# Patient Record
Sex: Male | Born: 2005 | Race: White | Hispanic: No | Marital: Single
Health system: Southern US, Community
[De-identification: ages and names within clinical notes are randomized; demographics above are authoritative.]

---
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
## 2024-12-17 ENCOUNTER — Other Ambulatory Visit: Payer: Self-pay

## 2024-12-17 ENCOUNTER — Emergency Department (HOSPITAL_COMMUNITY)
Admission: EM | Admit: 2024-12-17 | Discharge: 2024-12-17 | Disposition: A | Discharge status: Home / Self Care | Attending: Emergency Medicine | Admitting: Emergency Medicine

## 2024-12-17 DIAGNOSIS — T161XXA Foreign body in right ear, initial encounter: Secondary | ICD-10-CM | POA: Diagnosis present

## 2024-12-17 DIAGNOSIS — W44F4XA Insect entering into or through a natural orifice, initial encounter: Secondary | ICD-10-CM | POA: Diagnosis not present

## 2024-12-17 MED ORDER — OXYCODONE-ACETAMINOPHEN 5-325 MG PO TABS
1.0000 | ORAL_TABLET | Freq: Once | ORAL | Status: AC
  Administered 2024-12-17: 1 via ORAL
  Filled 2024-12-17: qty 1

## 2024-12-17 MED ORDER — CIPROFLOXACIN-DEXAMETHASONE 0.3-0.1 % OT SUSP: 4.0000 [drp] | Freq: Two times a day (BID) | OTIC | 0 refills | Status: AC

## 2024-12-17 NOTE — ED Triage Notes (Signed)
 Patient reports right ear ache this evening , denies injury , no hearing loss or drainage .

## 2024-12-17 NOTE — ED Provider Notes (Signed)
 " [place] [facility] Provider Note   CSN: [id number] Arrival date & time: 12/17/24  9686     Patient presents with: Otalgia (Right)   Tyler Montes is a 19 y.o. male.   The history is provided by the patient.  Otalgia Location:  Right Behind ear:  No abnormality Quality:  Aching Severity:  Moderate Onset quality:  Sudden Timing:  Constant Progression:  Unchanged Chronicity:  New Context: not direct blow and not recent URI   Relieved by:  Nothing Worsened by:  Nothing Associated symptoms: no fever        Prior to Admission medications  Medication Sig Start Date End Date Taking? Authorizing Provider  ciprofloxacin -dexamethasone  (CIPRODEX ) OTIC suspension Place 4 drops into the right ear 2 (two) times daily. 12/17/24  Yes Demiyah Fischbach, MD    Allergies: Egg protein-containing drug products, Milk-related compounds, and Tomato    Review of Systems  Constitutional:  Negative for fever.  HENT:  Positive for ear pain.   Respiratory:  Negative for wheezing and stridor.   All other systems reviewed and are negative.   Updated Vital Signs BP 126/81 (BP Location: Right Arm)   Pulse 70   Temp 97.6 F (36.4 C)   Resp 18   SpO2 99%   Physical Exam Vitals and nursing note reviewed.  Constitutional:      General: He is not in acute distress.    Appearance: Normal appearance. He is well-developed. He is not diaphoretic.  HENT:     Head: Normocephalic and atraumatic.     Left Ear: Tympanic membrane normal.     Ears:     Comments: Wax and insect adherent to R TM    Nose: Nose normal.  Eyes:     Conjunctiva/sclera: Conjunctivae normal.     Pupils: Pupils are equal, round, and reactive to light.  Cardiovascular:     Rate and Rhythm: Normal rate and regular rhythm.     Pulses: Normal pulses.     Heart sounds: Normal heart sounds.  Pulmonary:     Effort: Pulmonary effort is normal.     Breath sounds: Normal breath sounds. No  wheezing or rales.  Abdominal:     General: Bowel sounds are normal.     Palpations: Abdomen is soft.     Tenderness: There is no abdominal tenderness. There is no guarding or rebound.  Musculoskeletal:        General: Normal range of motion.     Cervical back: Normal range of motion and neck supple.  Skin:    General: Skin is warm and dry.     Capillary Refill: Capillary refill takes less than 2 seconds.  Neurological:     General: No focal deficit present.     Mental Status: He is alert and oriented to person, place, and time.     (all labs ordered are listed, but only abnormal results are displayed) Labs Reviewed - No data to display  EKG: None  Radiology: No results found.   Procedures   Medications Ordered in the ED  oxyCODONE -acetaminophen  (PERCOCET/ROXICET) 5-325 MG per tablet 1 tablet (1 tablet Oral Given 12/17/24 0323)                                    Medical Decision Making R ear pain   Amount and/or Complexity of Data Reviewed External Data Reviewed: notes.  Details: Previous notes reviewed   Risk Prescription drug management. Risk Details: Attempted irrigation with saline, multiple rounds.  Wax was removed. Insect still adherent to drum,  drum with bubbles.  It appears this inset has been there for some time.  Attempted curette. Unable to mover insect.  Patient unable to tolerate repeat attempted.  Attempted further irrigation without improvement. Will start antibiotic ear drops and have patient follow up with ENT for removal.         Final diagnoses:  Foreign body of ear, right, initial encounter   No signs of systemic illness or infection. The patient is nontoxic-appearing on exam and vital signs are within normal limits.  I have reviewed the triage vital signs and the nursing notes. Pertinent labs & imaging results that were available during my care of the patient were reviewed by me and considered in my medical decision making (see chart for  details). After history, exam, and medical workup I feel the patient has been appropriately medically screened and is safe for discharge home. Pertinent diagnoses were discussed with the patient. Patient was given return precautions.    ED Discharge Orders          Ordered    ciprofloxacin -dexamethasone  (CIPRODEX ) OTIC suspension  2 times daily        12/17/24 0635               Lashonna Rieke, MD 12/17/24 9293  "
